# Patient Record
Sex: Male | Born: 1985 | Race: White | Hispanic: No | Marital: Single | Smoking: Never smoker
Health system: Southern US, Community
[De-identification: ages and names within clinical notes are randomized; demographics above are authoritative.]

## PROBLEM LIST (undated history)

## (undated) HISTORY — PX: TONSILLECTOMY: SUR1361

---

## 2016-02-04 ENCOUNTER — Emergency Department (HOSPITAL_COMMUNITY): Payer: No Typology Code available for payment source

## 2016-02-04 ENCOUNTER — Emergency Department (HOSPITAL_COMMUNITY)
Admission: EM | Admit: 2016-02-04 | Discharge: 2016-02-04 | Disposition: A | Discharge status: Home / Self Care | Payer: No Typology Code available for payment source | Attending: Emergency Medicine | Admitting: Emergency Medicine

## 2016-02-04 ENCOUNTER — Encounter (HOSPITAL_COMMUNITY): Payer: Self-pay | Admitting: Emergency Medicine

## 2016-02-04 DIAGNOSIS — S01111A Laceration without foreign body of right eyelid and periocular area, initial encounter: Secondary | ICD-10-CM | POA: Diagnosis not present

## 2016-02-04 DIAGNOSIS — S0990XA Unspecified injury of head, initial encounter: Secondary | ICD-10-CM | POA: Diagnosis present

## 2016-02-04 DIAGNOSIS — Z79899 Other long term (current) drug therapy: Secondary | ICD-10-CM | POA: Diagnosis not present

## 2016-02-04 DIAGNOSIS — S40211A Abrasion of right shoulder, initial encounter: Secondary | ICD-10-CM | POA: Diagnosis not present

## 2016-02-04 DIAGNOSIS — Y92410 Unspecified street and highway as the place of occurrence of the external cause: Secondary | ICD-10-CM | POA: Diagnosis not present

## 2016-02-04 DIAGNOSIS — Y9389 Activity, other specified: Secondary | ICD-10-CM | POA: Diagnosis not present

## 2016-02-04 DIAGNOSIS — S92532A Displaced fracture of distal phalanx of left lesser toe(s), initial encounter for closed fracture: Secondary | ICD-10-CM | POA: Diagnosis not present

## 2016-02-04 DIAGNOSIS — Y998 Other external cause status: Secondary | ICD-10-CM | POA: Insufficient documentation

## 2016-02-04 DIAGNOSIS — S060X1A Concussion with loss of consciousness of 30 minutes or less, initial encounter: Secondary | ICD-10-CM | POA: Diagnosis not present

## 2016-02-04 DIAGNOSIS — S43001A Unspecified subluxation of right shoulder joint, initial encounter: Secondary | ICD-10-CM | POA: Insufficient documentation

## 2016-02-04 DIAGNOSIS — S92302A Fracture of unspecified metatarsal bone(s), left foot, initial encounter for closed fracture: Secondary | ICD-10-CM

## 2016-02-04 LAB — COMPREHENSIVE METABOLIC PANEL
ALBUMIN: 4.3 g/dL (ref 3.5–5.0)
ALT: 26 U/L (ref 17–63)
ANION GAP: 11 (ref 5–15)
AST: 38 U/L (ref 15–41)
Alkaline Phosphatase: 69 U/L (ref 38–126)
BILIRUBIN TOTAL: 1 mg/dL (ref 0.3–1.2)
BUN: 18 mg/dL (ref 6–20)
CHLORIDE: 105 mmol/L (ref 101–111)
CO2: 22 mmol/L (ref 22–32)
Calcium: 9.4 mg/dL (ref 8.9–10.3)
Creatinine, Ser: 0.95 mg/dL (ref 0.61–1.24)
GFR calc Af Amer: >60 mL/min (ref >60)
GFR calc non Af Amer: >60 mL/min (ref >60)
GLUCOSE: 98 mg/dL (ref 65–99)
POTASSIUM: 3.4 mmol/L — AB (ref 3.5–5.1)
SODIUM: 138 mmol/L (ref 135–145)
TOTAL PROTEIN: 7.5 g/dL (ref 6.5–8.1)

## 2016-02-04 LAB — CBC WITH DIFFERENTIAL/PLATELET
BASOS ABS: 0.1 10*3/uL (ref 0.0–0.1)
BASOS PCT: 1 %
EOS ABS: 0.4 10*3/uL (ref 0.0–0.7)
Eosinophils Relative: 6 %
HEMATOCRIT: 42.4 % (ref 39.0–52.0)
Hemoglobin: 14.8 g/dL (ref 13.0–17.0)
Lymphocytes Relative: 29 %
Lymphs Abs: 2 10*3/uL (ref 0.7–4.0)
MCH: 28.6 pg (ref 26.0–34.0)
MCHC: 34.9 g/dL (ref 30.0–36.0)
MCV: 81.9 fL (ref 78.0–100.0)
MONO ABS: 0.7 10*3/uL (ref 0.1–1.0)
MONOS PCT: 10 %
NEUTROS ABS: 3.7 10*3/uL (ref 1.7–7.7)
Neutrophils Relative %: 54 %
PLATELETS: 285 10*3/uL (ref 150–400)
RBC: 5.18 MIL/uL (ref 4.22–5.81)
RDW: 12.7 % (ref 11.5–15.5)
WBC: 6.9 10*3/uL (ref 4.0–10.5)

## 2016-02-04 LAB — I-STAT TROPONIN, ED: Troponin i, poc: 0 ng/mL (ref 0.00–0.08)

## 2016-02-04 MED ORDER — OXYCODONE-ACETAMINOPHEN 5-325 MG PO TABS: 1.0000 | ORAL_TABLET | Freq: Four times a day (QID) | ORAL | Status: DC | PRN

## 2016-02-04 MED ORDER — LIDOCAINE-EPINEPHRINE (PF) 2 %-1:200000 IJ SOLN
10.0000 mL | Freq: Once | INTRAMUSCULAR | Status: DC
  Filled 2016-02-04: qty 20

## 2016-02-04 MED ORDER — CEFAZOLIN SODIUM 1-5 GM-% IV SOLN: 1.0000 g | Freq: Once | INTRAVENOUS | Status: DC

## 2016-02-04 NOTE — Discharge Instructions (Signed)
Take motrin for pain.   Take percocet for severe pain. Do NOt drive with it.   Do not bear weight on left leg. Use sling as needed for right shoulder.   See your doctor.   See orthopedic doctor in a week.   Use splint at all times   Return to ER if you have severe pain, vomiting, severe headaches, lethargy

## 2016-02-04 NOTE — Progress Notes (Signed)
Orthopedic Tech Progress Note Patient Details:  Marlowe Altimothy R Remmert 11/09/1985 [id number]  Ortho Devices Type of Ortho Device: Ace wrap, Crutches, Post (short leg) splint, Stirrup splint   Saul FordyceJennifer C Britanee Vanblarcom 02/04/2016, 2:09 PM
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
## 2017-01-09 ENCOUNTER — Other Ambulatory Visit (HOSPITAL_COMMUNITY): Payer: Self-pay | Admitting: Sports Medicine

## 2017-01-09 ENCOUNTER — Ambulatory Visit (HOSPITAL_COMMUNITY)
Admission: RE | Admit: 2017-01-09 | Discharge: 2017-01-09 | Disposition: A | Discharge status: Home / Self Care | Payer: BLUE CROSS/BLUE SHIELD | Source: Ambulatory Visit | Attending: Sports Medicine | Admitting: Sports Medicine

## 2017-01-09 DIAGNOSIS — Z01818 Encounter for other preprocedural examination: Secondary | ICD-10-CM | POA: Diagnosis not present

## 2017-01-09 DIAGNOSIS — T1590XA Foreign body on external eye, part unspecified, unspecified eye, initial encounter: Secondary | ICD-10-CM
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
## 2017-12-25 ENCOUNTER — Ambulatory Visit: Payer: Self-pay | Admitting: Allergy & Immunology

## 2018-01-30 ENCOUNTER — Ambulatory Visit (INDEPENDENT_AMBULATORY_CARE_PROVIDER_SITE_OTHER): Payer: Managed Care, Other (non HMO) | Admitting: Allergy & Immunology

## 2018-01-30 ENCOUNTER — Encounter: Payer: Self-pay | Admitting: Allergy & Immunology

## 2018-01-30 VITALS — BP 122/80 | HR 84 | Temp 98.0°F | Resp 18 | Ht 67.5 in | Wt 178.4 lb

## 2018-01-30 DIAGNOSIS — J301 Allergic rhinitis due to pollen: Secondary | ICD-10-CM | POA: Insufficient documentation

## 2018-01-30 DIAGNOSIS — T781XXD Other adverse food reactions, not elsewhere classified, subsequent encounter: Secondary | ICD-10-CM

## 2018-01-30 DIAGNOSIS — J3089 Other allergic rhinitis: Secondary | ICD-10-CM

## 2018-01-30 DIAGNOSIS — J0101 Acute recurrent maxillary sinusitis: Secondary | ICD-10-CM | POA: Insufficient documentation

## 2018-01-30 DIAGNOSIS — T781XXA Other adverse food reactions, not elsewhere classified, initial encounter: Secondary | ICD-10-CM | POA: Insufficient documentation

## 2018-01-30 DIAGNOSIS — J302 Other seasonal allergic rhinitis: Secondary | ICD-10-CM | POA: Diagnosis not present

## 2018-01-30 DIAGNOSIS — J329 Chronic sinusitis, unspecified: Secondary | ICD-10-CM | POA: Diagnosis not present

## 2018-01-30 MED ORDER — FLUTICASONE PROPIONATE 50 MCG/ACT NA SUSP: 2.0000 | Freq: Two times a day (BID) | NASAL | 5 refills | Status: DC

## 2018-01-30 MED ORDER — AZELASTINE HCL 0.1 % NA SOLN: 2.0000 | Freq: Two times a day (BID) | NASAL | 5 refills | Status: DC

## 2018-01-30 NOTE — Patient Instructions (Addendum)
Recurrent sinus infections - Immune lab workup. We will call when your lab work returns, usually in 1-2 weeks  Seasonal and perennial allergic rhinitis Allergen avoidance measures provided for grass pollen, weed pollen, and tree pollen,and mold Flonase 2 sprays in each nostril twice a day Astelin nasal spray 2 sprays in each nostril twice a day Nasal saline rinses before any medicated sprays Begin Allegra 180 mg once a day If your medications are not effective, we can consider allergy injections.   Food reaction Testing for peanut, cotton seed, and white potato was negative today.  Keep a journal of foods that cause bumps on your tongue  Follow up in 1 month or sooner if needed
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
## 2018-01-31 ENCOUNTER — Encounter: Payer: Self-pay | Admitting: Allergy & Immunology

## 2018-02-03 ENCOUNTER — Telehealth: Payer: Self-pay | Admitting: Allergy & Immunology

## 2018-02-03 MED ORDER — AMOXICILLIN-POT CLAVULANATE 875-125 MG PO TABS: 1.0000 | ORAL_TABLET | Freq: Two times a day (BID) | ORAL | 0 refills | Status: AC

## 2018-02-03 NOTE — Telephone Encounter (Signed)
Dr. Gallagher any recommendations.  

## 2018-02-03 NOTE — Telephone Encounter (Signed)
Patient saw Dr. Dellis AnesGallagher on 01-30-18, with a sinus issue. He said over the weekend it turned into a sinus infection. He would like to speak to someone about this to get some relief.

## 2018-02-03 NOTE — Telephone Encounter (Signed)
Reviewed note. I will send in Augmentin 875mg  twice daily for ten days.  Malachi BondsJoel Debany Vantol, MD [facility]

## 2018-02-03 NOTE — Telephone Encounter (Signed)
Patient was contacted And informed that medications were sent it To contact pharmacy as to when they are ready for pickup

## 2018-02-04 ENCOUNTER — Encounter: Payer: Self-pay | Admitting: Allergy & Immunology

## 2018-02-04 LAB — STREP PNEUMONIAE 23 SEROTYPES IGG
PNEUMO AB TYPE 20: 0.8 ug/mL — AB (ref >1.3)
PNEUMO AB TYPE 22 (22F): 0.8 ug/mL — AB (ref >1.3)
PNEUMO AB TYPE 26 (6B): 1.3 ug/mL — AB (ref >1.3)
PNEUMO AB TYPE 34 (10A): 0.1 ug/mL — AB (ref >1.3)
PNEUMO AB TYPE 4: 0.4 ug/mL — AB (ref >1.3)
PNEUMO AB TYPE 57 (19A): 2.8 ug/mL (ref >1.3)
PNEUMO AB TYPE 68 (9V): 6.9 ug/mL (ref >1.3)
PNEUMO AB TYPE 70 (33F): 0.4 ug/mL — AB (ref >1.3)
PNEUMO AB TYPE 8: 0.4 ug/mL — AB (ref >1.3)
Pneumo Ab Type 1*: 1.9 ug/mL (ref >1.3)
Pneumo Ab Type 12 (12F)*: 0.2 ug/mL — ABNORMAL LOW (ref >1.3)
Pneumo Ab Type 14*: 0.3 ug/mL — ABNORMAL LOW (ref >1.3)
Pneumo Ab Type 17 (17F)*: 1 ug/mL — ABNORMAL LOW (ref >1.3)
Pneumo Ab Type 19 (19F)*: 2.2 ug/mL (ref >1.3)
Pneumo Ab Type 2*: 0.3 ug/mL — ABNORMAL LOW (ref >1.3)
Pneumo Ab Type 23 (23F)*: <0.1 ug/mL — ABNORMAL LOW (ref >1.3)
Pneumo Ab Type 3*: 1.7 ug/mL (ref >1.3)
Pneumo Ab Type 43 (11A)*: <0.1 ug/mL — ABNORMAL LOW (ref >1.3)
Pneumo Ab Type 5*: 0.3 ug/mL — ABNORMAL LOW (ref >1.3)
Pneumo Ab Type 51 (7F)*: 0.7 ug/mL — ABNORMAL LOW (ref >1.3)
Pneumo Ab Type 54 (15B)*: 0.2 ug/mL — ABNORMAL LOW (ref >1.3)
Pneumo Ab Type 56 (18C)*: 0.2 ug/mL — ABNORMAL LOW (ref >1.3)
Pneumo Ab Type 9 (9N)*: 11.5 ug/mL (ref >1.3)

## 2018-02-04 LAB — CBC WITH DIFFERENTIAL/PLATELET
BASOS: 2 %
Basophils Absolute: 0.2 10*3/uL (ref 0.0–0.2)
EOS (ABSOLUTE): 1 10*3/uL — ABNORMAL HIGH (ref 0.0–0.4)
EOS: 10 %
HEMATOCRIT: 44.1 % (ref 37.5–51.0)
Hemoglobin: 15.2 g/dL (ref 13.0–17.7)
IMMATURE GRANS (ABS): 0.1 10*3/uL (ref 0.0–0.1)
IMMATURE GRANULOCYTES: 1 %
LYMPHS: 25 %
Lymphocytes Absolute: 2.5 10*3/uL (ref 0.7–3.1)
MCH: 28.4 pg (ref 26.6–33.0)
MCHC: 34.5 g/dL (ref 31.5–35.7)
MCV: 82 fL (ref 79–97)
MONOS ABS: 1.2 10*3/uL — AB (ref 0.1–0.9)
Monocytes: 12 %
NEUTROS ABS: 5.2 10*3/uL (ref 1.4–7.0)
NEUTROS PCT: 50 %
Platelets: 362 10*3/uL (ref 150–379)
RBC: 5.35 x10E6/uL (ref 4.14–5.80)
RDW: 14 % (ref 12.3–15.4)
WBC: 10 10*3/uL (ref 3.4–10.8)

## 2018-02-04 LAB — IGG, IGA, IGM
IGG (IMMUNOGLOBIN G), SERUM: 1203 mg/dL (ref 700–1600)
IgA/Immunoglobulin A, Serum: 475 mg/dL — ABNORMAL HIGH (ref 90–386)
IgM (Immunoglobulin M), Srm: 145 mg/dL (ref 20–172)

## 2018-02-04 LAB — DIPHTHERIA / TETANUS ANTIBODY PANEL
Diphtheria Ab: 0.92 IU/mL (ref <0.10)
Tetanus Ab, IgG: 3.12 IU/mL (ref <0.10)

## 2018-02-04 LAB — COMPLEMENT, TOTAL: Compl, Total (CH50): >60 U/mL (ref >41)

## 2018-07-03 IMAGING — DX DG ORBITS FOR FOREIGN BODY
2 series · 2 of 2 positions shown · non-contrast
Comparison: CT HEAD February 04, 2016

CLINICAL DATA: Metal working/exposure; clearance prior to MRI.

EXAM:
ORBITS FOR FOREIGN BODY - 1 VIEW

[orbits waters (1 of 2)]
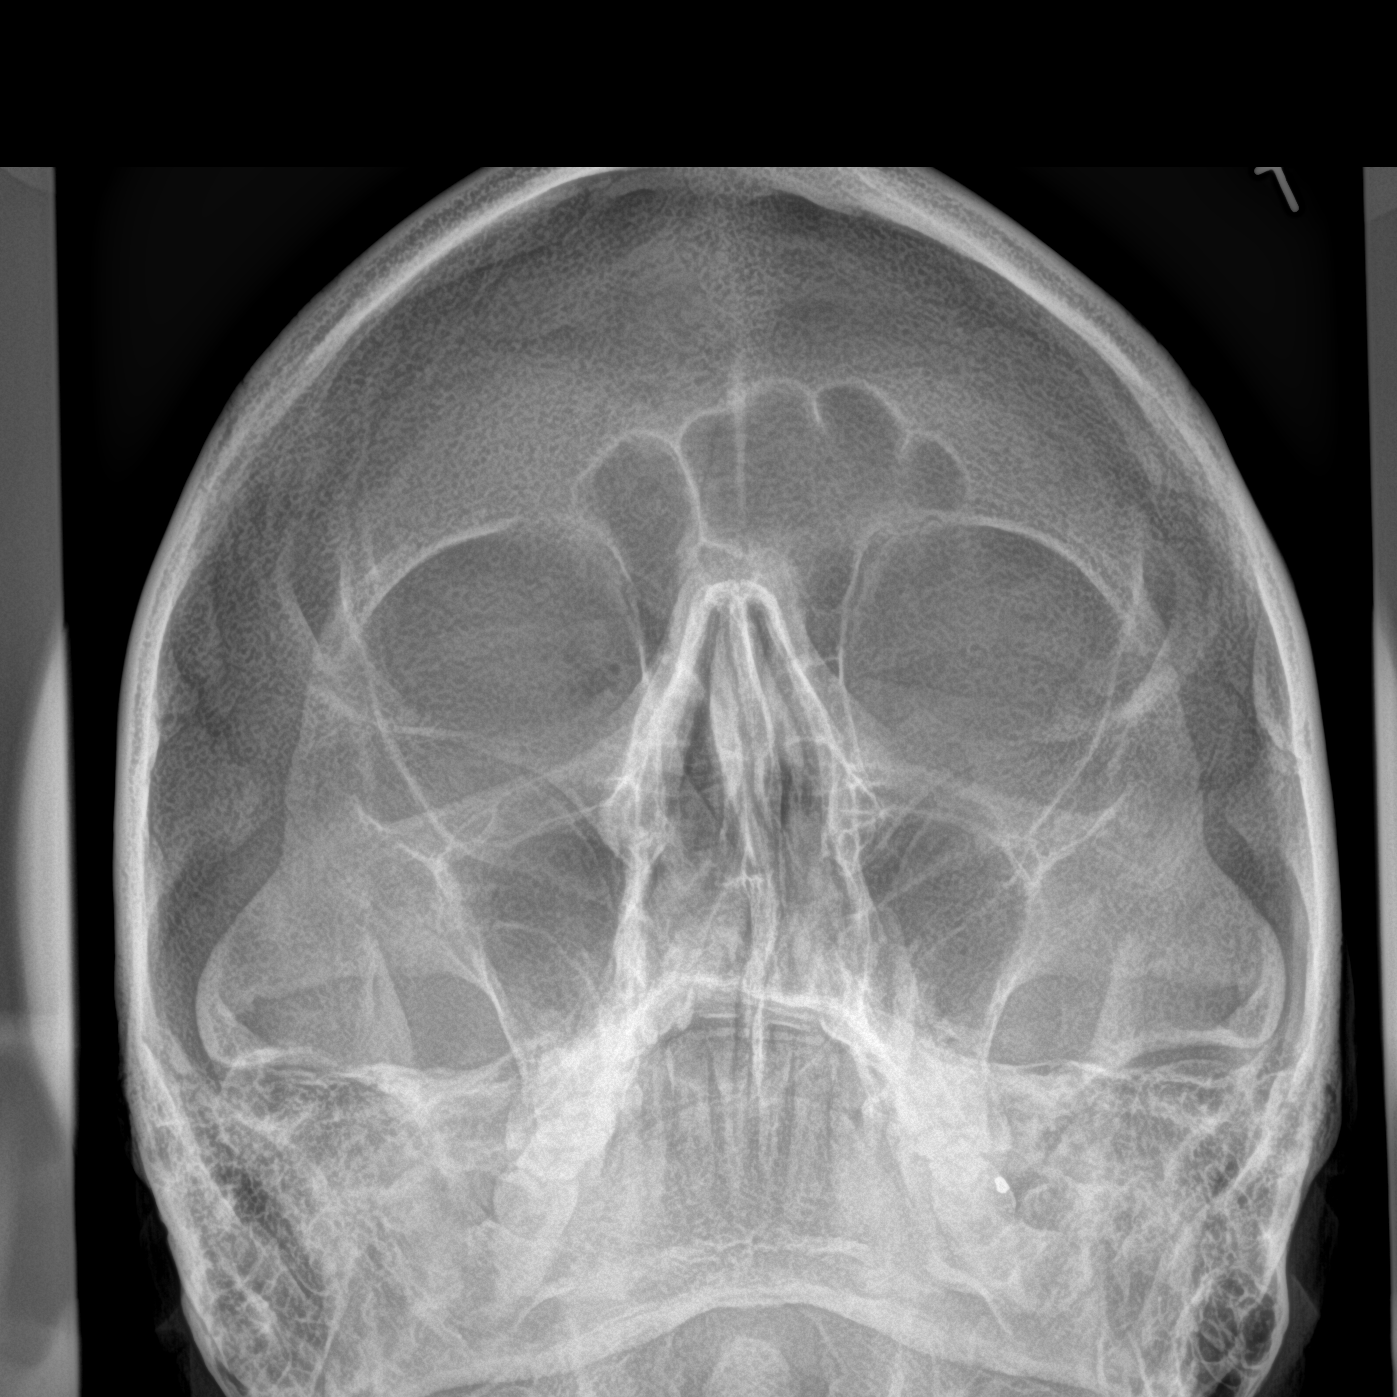

[orbits waters (2 of 2)]
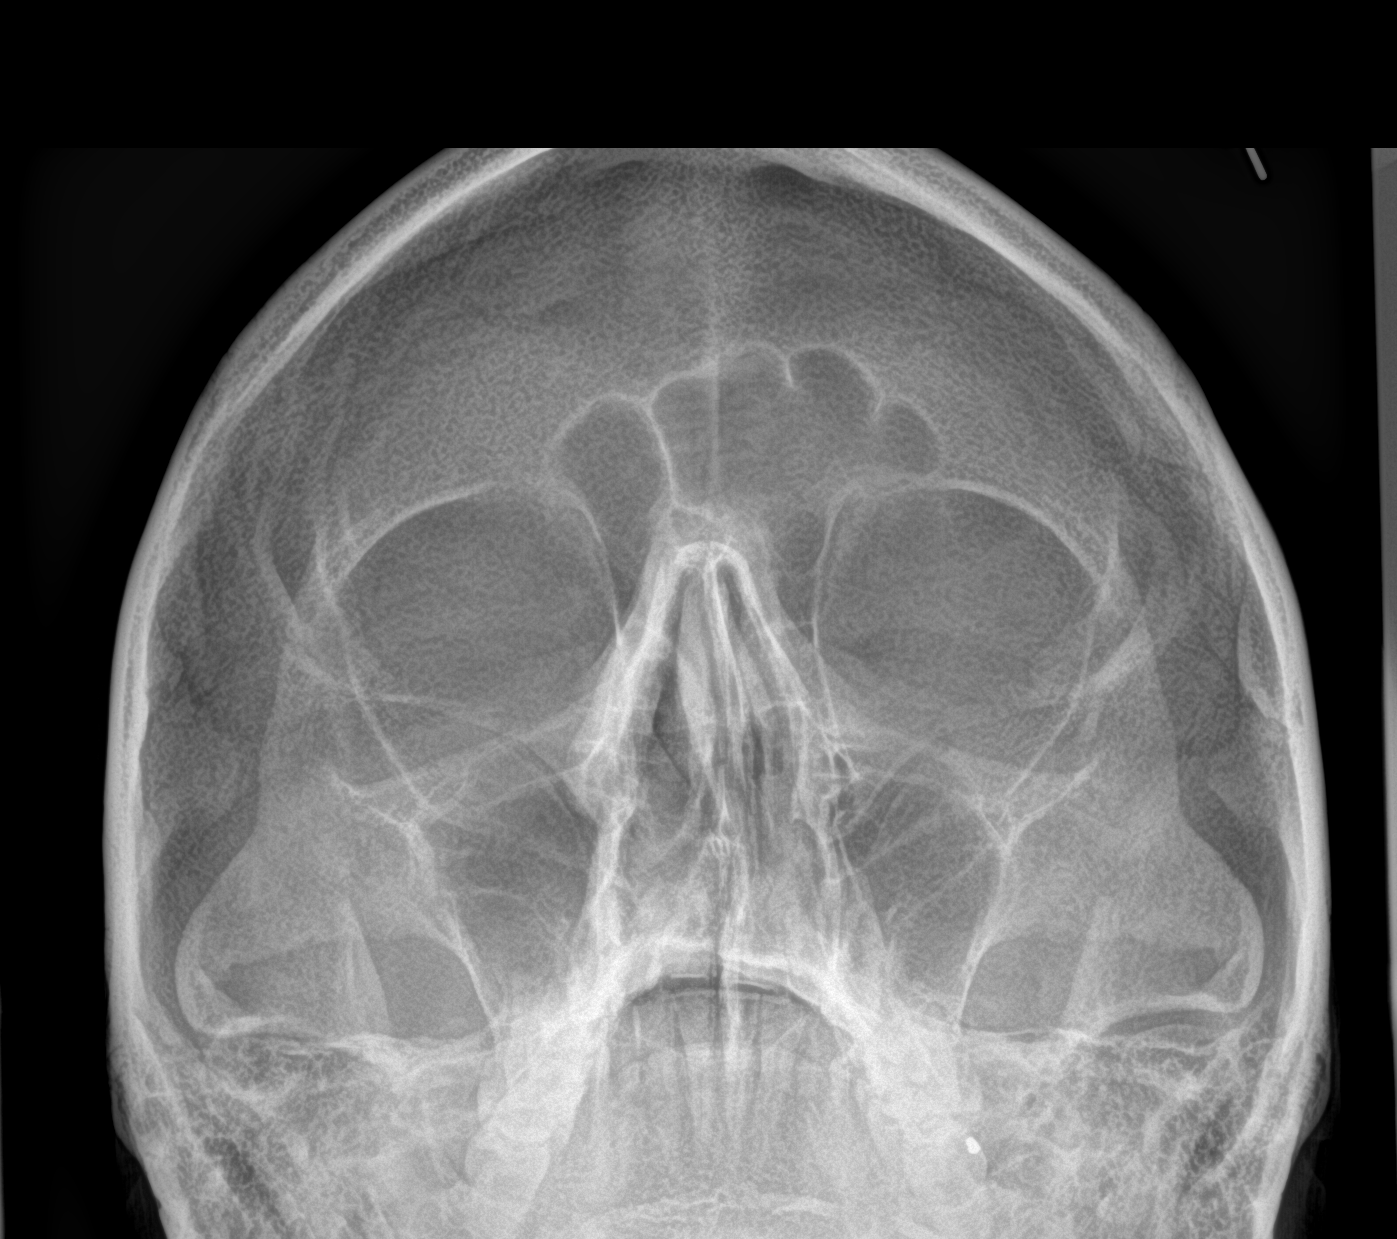

[2 of 2 positions shown; findings below may reference images not displayed]

FINDINGS: There is no evidence of metallic foreign body within the orbits. No
significant bone abnormality identified.
IMPRESSION: No evidence of metallic foreign body within the orbits.

## 2018-08-16 ENCOUNTER — Other Ambulatory Visit: Payer: Self-pay | Admitting: Family Medicine

## 2018-08-25 ENCOUNTER — Other Ambulatory Visit: Payer: Self-pay | Admitting: Allergy

## 2018-08-25 MED ORDER — FLUTICASONE PROPIONATE 50 MCG/ACT NA SUSP: 2.0000 | Freq: Two times a day (BID) | NASAL | 2 refills | Status: DC

## 2018-10-17 ENCOUNTER — Other Ambulatory Visit: Payer: Self-pay | Admitting: Family Medicine

## 2019-02-13 ENCOUNTER — Other Ambulatory Visit: Payer: Self-pay | Admitting: Family Medicine

## 2019-02-17 ENCOUNTER — Ambulatory Visit (INDEPENDENT_AMBULATORY_CARE_PROVIDER_SITE_OTHER): Payer: Managed Care, Other (non HMO) | Admitting: Allergy & Immunology

## 2019-02-17 ENCOUNTER — Other Ambulatory Visit: Payer: Self-pay

## 2019-02-17 ENCOUNTER — Encounter: Payer: Self-pay | Admitting: Allergy & Immunology

## 2019-02-17 DIAGNOSIS — J302 Other seasonal allergic rhinitis: Secondary | ICD-10-CM

## 2019-02-17 DIAGNOSIS — J3089 Other allergic rhinitis: Secondary | ICD-10-CM | POA: Diagnosis not present

## 2019-02-17 MED ORDER — FLUTICASONE PROPIONATE 50 MCG/ACT NA SUSP: 2.0000 | Freq: Two times a day (BID) | NASAL | 4 refills | Status: DC

## 2019-02-17 NOTE — Patient Instructions (Addendum)
Seasonal and perennial allergic rhinitis - Continue with the fluticasone 2 sprays in each nostril twice a day. - Try using Ayr nasal gel.  - Continue using nasal rinses as needed. - You can purchase the fluticasone over the counter if you do not want to come for follow up appointments.   Return as needed.

## 2019-02-17 NOTE — Progress Notes (Signed)
RE: Justin Bridges MRN: [id number] DOB: 12/15/1985 Date of Telemedicine Visit: 02/17/2019  Referring provider: No ref. provider found Primary care provider: Patient, No Pcp Per  Chief Complaint: Allergic Rhinitis    Telemedicine Follow Up Visit via Telephone: I connected with Justin Bridges for a follow up on 02/18/19 by telephone and verified that I am speaking with the correct person using two identifiers.   I discussed the limitations, risks, security and privacy concerns of performing an evaluation and management service by telephone and the availability of in person appointments. I also discussed with the patient that there may be a patient responsible charge related to this service. The patient expressed understanding and agreed to proceed.  Patient is at home accompanied by himself who provided/contributed to the history.  Provider is at the office.  Visit start time: 3:24 PM Visit end time: 3:40 PM Insurance consent/check in by: [employer] Medical consent and medical assistant/nurse: Ladona Ridgelaylor   History of Present Illness:  He is a 33 y.o. male, who is being followed for perennial and seasonal allergic rhinitis. His previous allergy office visit was in April 2019 with Dr. Dellis AnesGallagher. At that time, he had testing for rhinitis symptoms which demonstrated positives to grasses, weeds, trees, and mold. We started fluticasone two sprays per nostril twice daily as well as Astelin two sprays per nostril twice daily. We also started allegra 180mg  daily. He was concerned with food allergies, but testing to selected foods was negative. We recommended keeping a journal for future food reactions.   Since the last visit, he has done well. He remains on the medications but has been decreasing the amount of Claritin that he is using. The only concern that he has is during the winter, he feels that his sinuses get dried out and get nose bleeds. He is wondering if there are any recommendations regarding  treatment for the dry nose.   He is using his fluticasone fairly regularly. Today he asks me for "unlimited refills", presumably because he does not want to follow up any longer. He is not using the Astelin at this point since he did not like the taste of it. His regimen is working well, as he has not needed antibiotic treatments for sinus infections.   Otherwise, there have been no changes to his past medical history, surgical history, family history, or social history.  Assessment and Plan:  Justin Bridges is a 33 y.o. male with:  Seasonal and perennial allergic rhinitis (grasses, weeds, trees, molds)  Recurrent sinus infections - resolved with nasal regimen   Seasonal and perennial allergic rhinitis - Continue with the fluticasone 2 sprays in each nostril twice a day. - Try using Ayr nasal gel.  - Continue using nasal rinses as needed. - You can purchase the fluticasone over the counter if you do not want to come for follow up appointments.   Return as needed.      Diagnostics: None.  Medication List:  Current Outpatient Medications  Medication Sig Dispense Refill  . fluticasone (FLONASE) 50 MCG/ACT nasal spray Place 2 sprays into both nostrils 2 (two) times daily. 16 g 4  . loratadine-pseudoephedrine (CLARITIN-D 12-HOUR) 5-120 MG tablet Take 1 tablet by mouth 2 (two) times daily.     No current facility-administered medications for this visit.    Allergies: No Known Allergies I reviewed his past medical history, social history, family history, and environmental history and no significant changes have been reported from previous visits.  Review of Systems  Constitutional: Negative for chills, diaphoresis, fatigue and fever.  HENT: Negative for congestion, ear discharge, ear pain, facial swelling, postnasal drip, rhinorrhea, sinus pressure, sinus pain and sore throat.   Eyes: Negative for pain, discharge and itching.  Respiratory: Negative for apnea, cough, chest tightness and  shortness of breath.   Cardiovascular: Negative for chest pain.  Gastrointestinal: Negative for diarrhea and nausea.  Musculoskeletal: Negative for arthralgias and myalgias.  Skin: Negative for rash.  Allergic/Immunologic: Positive for environmental allergies. Negative for food allergies.    Objective:  Physical exam not obtained as encounter was done via telephone.   Previous notes and tests were reviewed.  I discussed the assessment and treatment plan with the patient. The patient was provided an opportunity to ask questions and all were answered. The patient agreed with the plan and demonstrated an understanding of the instructions.   The patient was advised to call back or seek an in-person evaluation if the symptoms worsen or if the condition fails to improve as anticipated.  I provided 16 minutes of non-face-to-face time during this encounter.  It was my pleasure to participate in Justin Bridges's care today. Please feel free to contact me with any questions or concerns.   Sincerely,  Alfonse Spruce, MD

## 2019-02-18 ENCOUNTER — Encounter: Payer: Self-pay | Admitting: Allergy & Immunology

## 2019-04-16 ENCOUNTER — Encounter: Payer: Self-pay | Admitting: Allergy & Immunology

## 2019-04-16 ENCOUNTER — Other Ambulatory Visit: Payer: Self-pay

## 2019-04-16 ENCOUNTER — Ambulatory Visit (INDEPENDENT_AMBULATORY_CARE_PROVIDER_SITE_OTHER): Payer: Managed Care, Other (non HMO) | Admitting: Allergy & Immunology

## 2019-04-16 DIAGNOSIS — J3089 Other allergic rhinitis: Secondary | ICD-10-CM

## 2019-04-16 DIAGNOSIS — J302 Other seasonal allergic rhinitis: Secondary | ICD-10-CM | POA: Insufficient documentation

## 2019-04-16 DIAGNOSIS — J329 Chronic sinusitis, unspecified: Secondary | ICD-10-CM

## 2019-04-16 DIAGNOSIS — J0101 Acute recurrent maxillary sinusitis: Secondary | ICD-10-CM

## 2019-04-16 MED ORDER — AMOXICILLIN-POT CLAVULANATE 875-125 MG PO TABS: 1.0000 | ORAL_TABLET | Freq: Two times a day (BID) | ORAL | 0 refills | Status: AC

## 2019-04-16 NOTE — Progress Notes (Signed)
RE: Justin Bridges MRN: [id number] DOB: 07/07/1986 Date of Telemedicine Visit: 04/16/2019  Referring provider: No ref. provider found Primary care provider: Patient, No Pcp Per  Chief Complaint: Sinus Problem   Telemedicine Follow Up Visit via Telephone: I connected with Justin Bridges for a follow up on 04/16/19 by telephone and verified that I am speaking with the correct person using two identifiers.   I discussed the limitations, risks, security and privacy concerns of performing an evaluation and management service by telephone and the availability of in person appointments. I also discussed with the patient that there may be a patient responsible charge related to this service. The patient expressed understanding and agreed to proceed.  Patient is at home Provider is at the office.  Visit start time: 2:07 Visit end time: 2:33 Insurance consent/check in by: [employer]ette [employer] Medical consent and medical assistant/nurse: Maryjean MornLogan Freeman  History of Present Illness: He is a 33 y.o. male, who is being followed for allergic rhinitis and recurrent sinus infections. His previous allergy office visit was on 02/17/2019 with Dr. Dellis AnesGallagher. At today's visit, he reports that he has been experiencing nasal congestion, sinus pressure, and green drainage for the last 2 weeks. He began taking Claritin about 1 1/2 weeks ago and continues Flonase and saline nasal rinses daily. He denies fever and cough. He has not needed an antibiotic or prednisone over the last year. He reports that he did not get a pneunovax at his primary care provider's office since his last visit to this clinic. His current medications are listed in the chart.  Assessment and Plan: Acute sinusitis Begin Augmentin 875 mg twice a day for 10 days for infection Continue nasal sinus rinses 2 times a day Begin Mucinex 1200 mg twice a day for 10 days Stop Claritin while on antibiotic and Mucinex  Allergic rhinitis Continue Flonase 2 sprays once  a day in each nostril Continue saline nasal rinses as above After you finish your antibiotic, you may begin Claritin as needed for a runny nose  Recurrent infections Get a pneumovax vaccine from your primary care provider Follow up with labs at [facility] 4-6 weeks after you get your pneumovax  Call the clinic if this treatment plan is not working well for you  Follow up in 1-2 months   Return in about 2 months (around 06/16/2019), or if symptoms worsen or fail to improve.  Meds ordered this encounter  Medications  . amoxicillin-clavulanate (AUGMENTIN) 875-125 MG tablet    Sig: Take 1 tablet by mouth 2 (two) times daily for 10 days.    Dispense:  20 tablet    Refill:  0    Lab Orders     Strep pneumoniae 23 Serotypes IgG   Medication List:  Current Outpatient Medications  Medication Sig Dispense Refill  . fluticasone (FLONASE) 50 MCG/ACT nasal spray Place 2 sprays into both nostrils 2 (two) times daily. 16 g 4  . loratadine-pseudoephedrine (CLARITIN-D 12-HOUR) 5-120 MG tablet Take 1 tablet by mouth daily.     Marland Kitchen. amoxicillin-clavulanate (AUGMENTIN) 875-125 MG tablet Take 1 tablet by mouth 2 (two) times daily for 10 days. 20 tablet 0   No current facility-administered medications for this visit.    Allergies: No Known Allergies I reviewed his past medical history, social history, family history, and environmental history and no significant changes have been reported from previous visit on 02/17/2019.  Objective: Physical Exam Not obtained as encounter was done via telephone.   Previous notes and  tests were reviewed.  I discussed the assessment and treatment plan with the patient. The patient was provided an opportunity to ask questions and all were answered. The patient agreed with the plan and demonstrated an understanding of the instructions.   The patient was advised to call back or seek an in-person evaluation if the symptoms worsen or if the condition fails to improve as  anticipated.  I provided 26 minutes of non-face-to-face time during this encounter.  It was my pleasure to participate in [place]'s care today. Please feel free to contact me with any questions or concerns.   Sincerely,  Gareth Morgan, FNP

## 2019-04-16 NOTE — Patient Instructions (Signed)
Acute sinusitis Begin Augmentin 875 mg twice a day for 10 days for infection Continue nasal sinus rinses 2 times a day Begin Mucinex 1200 mg twice a day for 10 days Stop Claritin while on antibiotic and Mucinex  Allergic rhinitis Continue Flonase 2 sprays once a day in each nostril Continue saline nasal rinses as above After you finish your antibiotic, you may begin Claritin as needed for a runny nose  Recurrent infections Get a pneumovax vaccine from your primary care provider Follow up with labs at [place] 4-6 weeks after you get your pneumovax  Call the clinic if this treatment plan is not working well for you  Follow up in 1-2 months

## 2019-04-27 ENCOUNTER — Other Ambulatory Visit: Payer: Self-pay | Admitting: *Deleted

## 2019-04-27 NOTE — Telephone Encounter (Signed)
Patient called states he would like another antibiotic since mucous is still neon green/dark green. Does not want to do another telemed visit Dr Ernst Bowler please advise

## 2019-04-28 MED ORDER — LEVOFLOXACIN 500 MG PO TABS: 500.0000 mg | ORAL_TABLET | Freq: Every day | ORAL | 0 refills | Status: AC

## 2019-04-28 NOTE — Telephone Encounter (Signed)
Called patient advised of plan patient verbalized understanding. Levaquin sent in

## 2019-04-28 NOTE — Telephone Encounter (Signed)
Augmentin is an excellent sinusitis drug. We can send in Levaquin 500mg  daily for two weeks (pended drug, please confirm pharmacy since he has approximately 364 pharmacy options listed). Please ask him to avoid strenuous exercise since this can cause tendon breaks and inflammation.  If this does not work, we will need to get a sinus CT.   Justin Marvel, MD [facility]

## 2019-06-03 ENCOUNTER — Ambulatory Visit: Payer: Self-pay | Admitting: Family Medicine

## 2019-07-26 ENCOUNTER — Other Ambulatory Visit: Payer: Self-pay | Admitting: Allergy & Immunology

## 2022-02-09 ENCOUNTER — Telehealth: Payer: Self-pay

## 2022-02-09 NOTE — Telephone Encounter (Signed)
Patient called in - DOB verified - requested a Rx for Pneumovax. Advised LOV was 02/17/2019 and televisit  was on 04/16/2019 where he was advised by the provider to obtain his Pneumovax from his PCP: ? ?Recurrent infections ?Get a pneumovax vaccine from your primary care provider ?Follow up with labs at [facility] 4-6 weeks after you get your pneumovax ? ? ?Patient was advised he would need to schedule an office visit either with the [facility] or [place] office, contact his PCP or go to an Urgent Care (for a Rx). Patient declined scheduling an appointment, stating he would call back .  ? ? ?
